# Patient Record
Sex: Male | Born: 1975 | Race: White | Marital: Married | Smoking: Never smoker
Health system: Southern US, Community
[De-identification: ages and names within clinical notes are randomized; demographics above are authoritative.]

---

## 2012-06-03 ENCOUNTER — Other Ambulatory Visit: Payer: Self-pay | Admitting: Family Medicine

## 2012-06-08 ENCOUNTER — Ambulatory Visit
Admission: RE | Admit: 2012-06-08 | Discharge: 2012-06-08 | Disposition: A | Discharge status: Home / Self Care | Payer: BC Managed Care – PPO | Source: Ambulatory Visit | Attending: Family Medicine | Admitting: Family Medicine

## 2012-06-08 DIAGNOSIS — N50811 Right testicular pain: Secondary | ICD-10-CM

## 2013-10-25 ENCOUNTER — Ambulatory Visit (INDEPENDENT_AMBULATORY_CARE_PROVIDER_SITE_OTHER): Payer: BC Managed Care – PPO | Admitting: General Surgery

## 2013-10-25 ENCOUNTER — Encounter (INDEPENDENT_AMBULATORY_CARE_PROVIDER_SITE_OTHER): Payer: Self-pay | Admitting: General Surgery

## 2013-10-25 VITALS — BP 142/86 | HR 71 | Temp 97.2°F | Resp 16 | Ht 70.0 in | Wt 273.2 lb

## 2013-10-25 DIAGNOSIS — K644 Residual hemorrhoidal skin tags: Secondary | ICD-10-CM

## 2013-10-25 NOTE — Progress Notes (Signed)
Patient ID: Chad Todd, male   DOB: [birth year], 38 y.o.   MRN: [id number]  No chief complaint on file.   HPI Chad Todd is a 38 y.o. male.  The patient is a [employer] [employer] who presents today secondary to refills including anal skin tag. He states this gets irritated and he notices blood when he wipes after a bowel movement. He also states he has some itching and discomfort in the area. The patient does state he takes some supplemental fiber, Metamucil. He also states well-hydrated and has a large water bottle in the office. The patient states he notices that the discomfort is associated with increased activity to include walking and running. Additional states he has not Chad Todd for very long while having a bowel movement and has no constipation issues.  HPI  No past medical history on file.  No past surgical history on file.  No family history on file.  Social History History  Substance Use Topics  . Smoking status: Never Smoker   . Smokeless tobacco: Not on file  . Alcohol Use: No    No Known Allergies  No current outpatient prescriptions on file.   No current facility-administered medications for this visit.    Review of Systems Review of Systems  Constitutional: Negative.   HENT: Negative.   Eyes: Negative.   Respiratory: Negative.   Cardiovascular: Negative.   Gastrointestinal: Negative.   Endocrine: Negative.   Neurological: Negative.     Blood pressure 142/86, pulse 71, temperature 97.2 F (36.2 C), temperature source Temporal, resp. rate 16, height 5\' 10"  (1.778 m), weight 273 lb 3.2 oz (123.923 kg).  Physical Exam Physical Exam  Constitutional: He is oriented to person, place, and time. He appears well-developed and well-nourished.  HENT:  Head: Normocephalic and atraumatic.  Eyes: Conjunctivae and EOM are normal. Pupils are equal, round, and reactive to light.  Neck: Normal range of motion. Neck supple.  Cardiovascular: Normal rate, regular rhythm and  normal heart sounds.   Pulmonary/Chest: Effort normal and breath sounds normal.  Genitourinary:     Musculoskeletal: Normal range of motion.  Neurological: He is alert and oriented to person, place, and time.  Skin: Skin is warm and dry.    Data Reviewed none  Assessment    38 year old male with external skin tag at 12:00 in the lithotomy position, there is a small skin tear near the anus. There is no external hemorrhoids or internal hemorrhoids.     Plan    1.  The patient's would like to do to proceed with exam under anesthesia and excision of the anal skin tag versus in clinic procedure of excision of anal skin tag. 2. Discussed with them the we can proceed in scheduling the procedure once he is about this decision.        Chad Ehlersamirez Jr., Chad Todd 10/25/2013, 2:46 PM

## 2013-11-14 IMAGING — US US ART/VEN ABD/PELV/SCROTUM DOPPLER LTD
1 series · 14 of 25 positions shown · non-contrast
Comparison: None.

CLINICAL DATA: Right testicular pain

SCROTAL ULTRASOUND
DOPPLER ULTRASOUND OF THE TESTICLES
TECHNIQUE: Complete ultrasound examination of the testicles,
epididymis, and other scrotal structures was performed.  Color and
spectral Doppler ultrasound were also utilized to evaluate blood
flow to the testicles.

[Series 1: us art/ven abd/pelv/scrotum doppler ltd · 0.07mm/px · 14 of 68 slices shown]
[im 1/68]
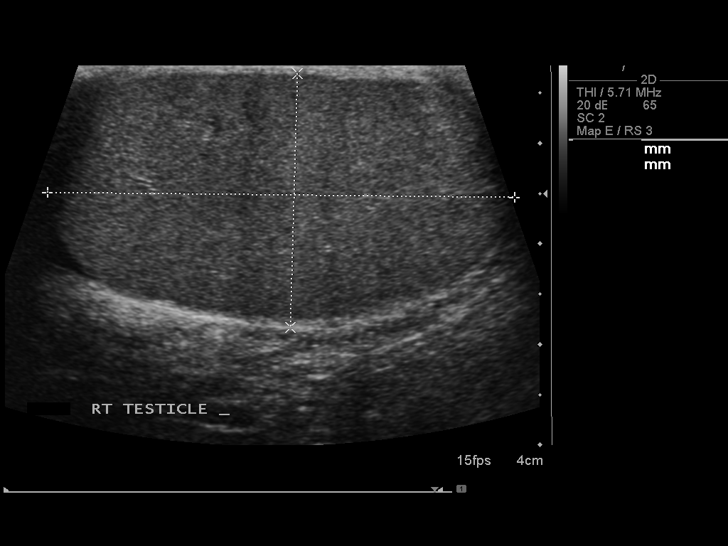
[im 6/68]
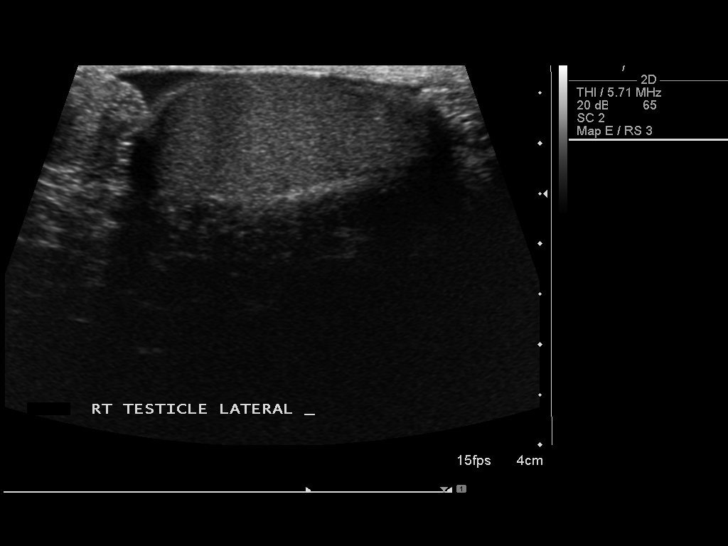
[im 12/68]
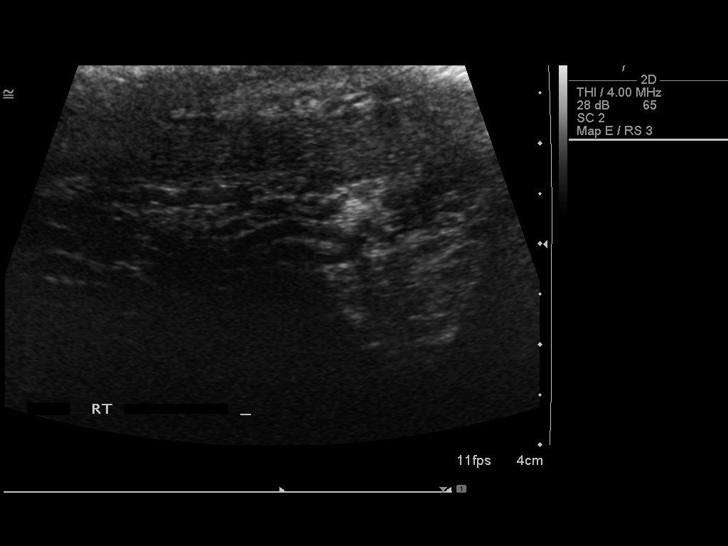
[im 17/68]
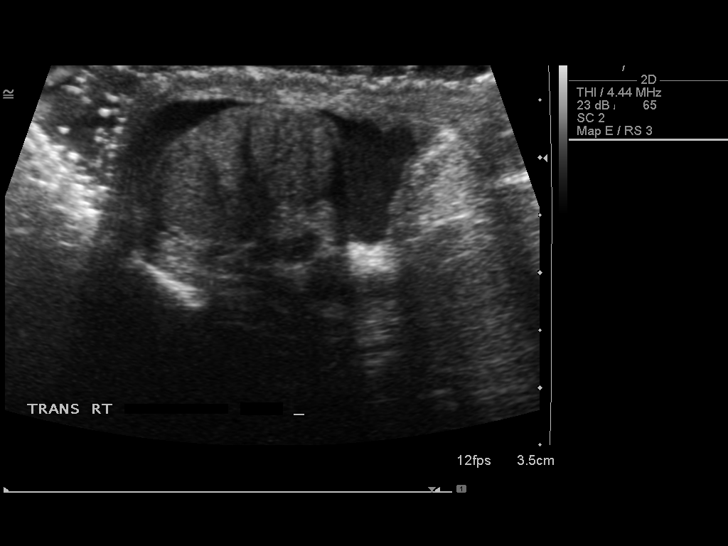
[im 23/68]
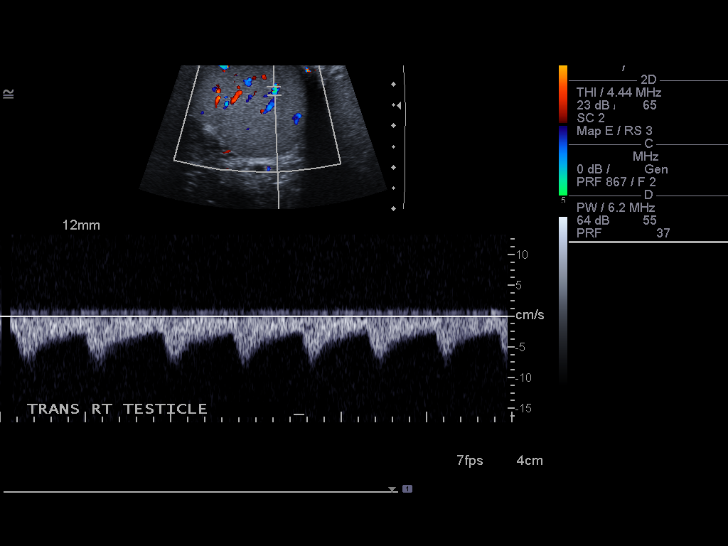
[im 26/68]
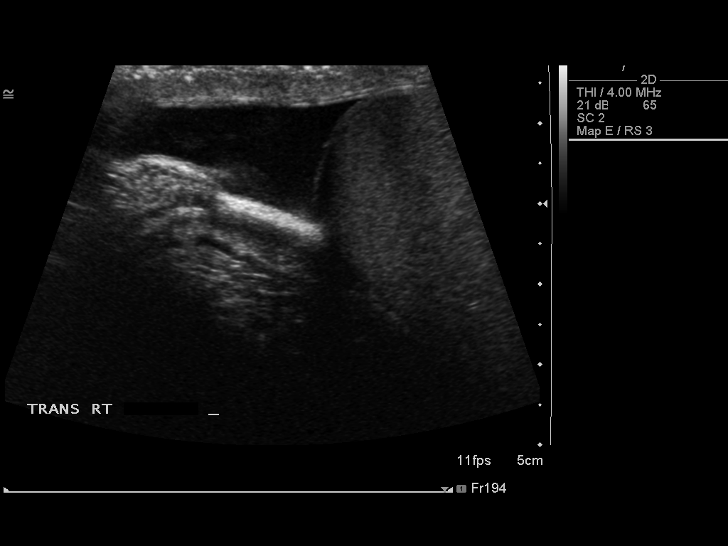
[im 31/68]
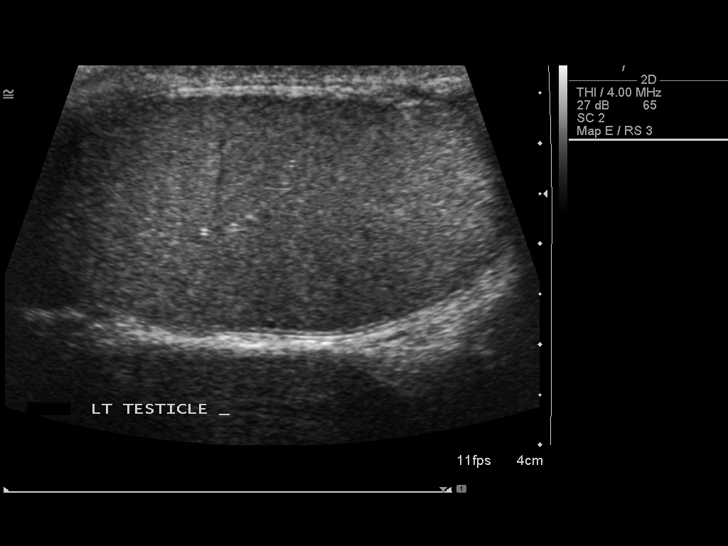
[im 37/68]
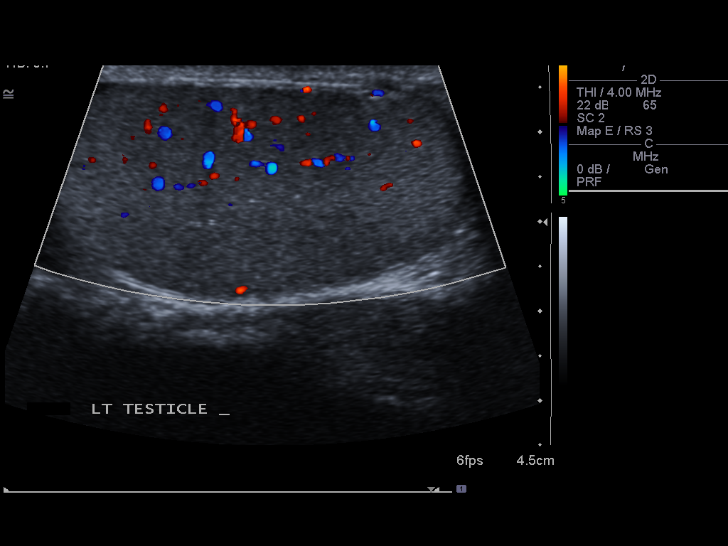
[im 42/68]
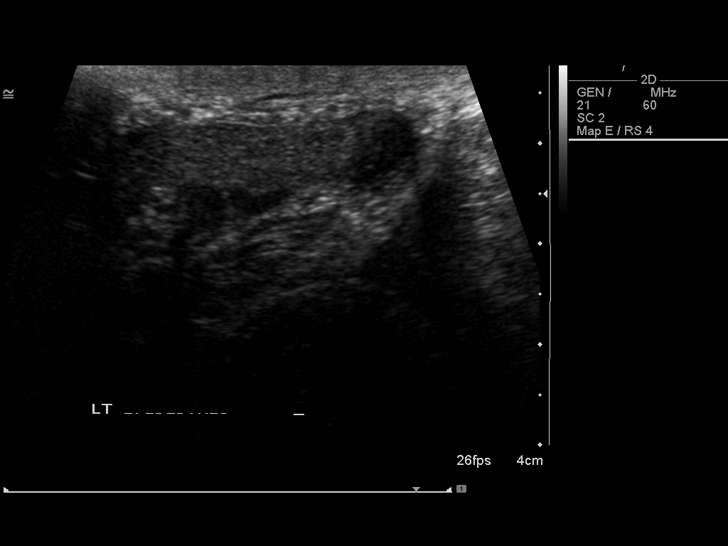
[im 45/68]
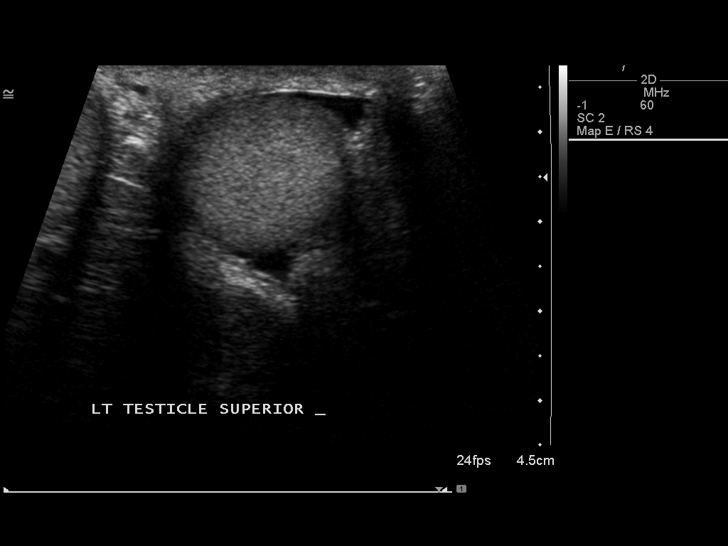
[im 51/68]
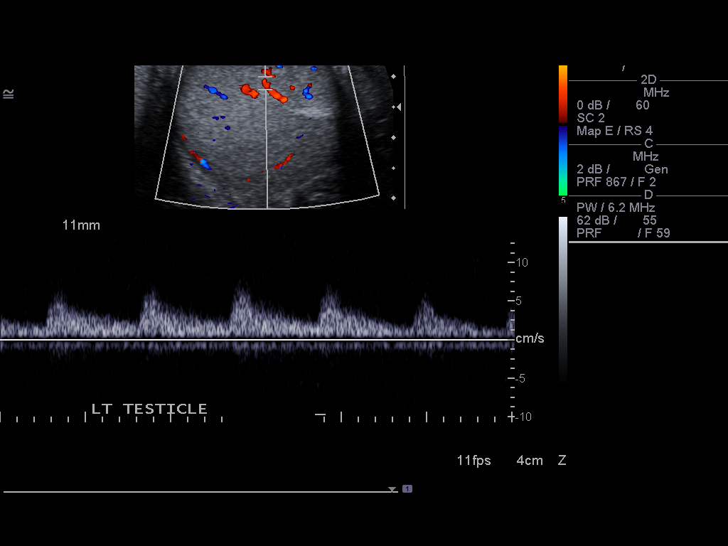
[im 56/68]
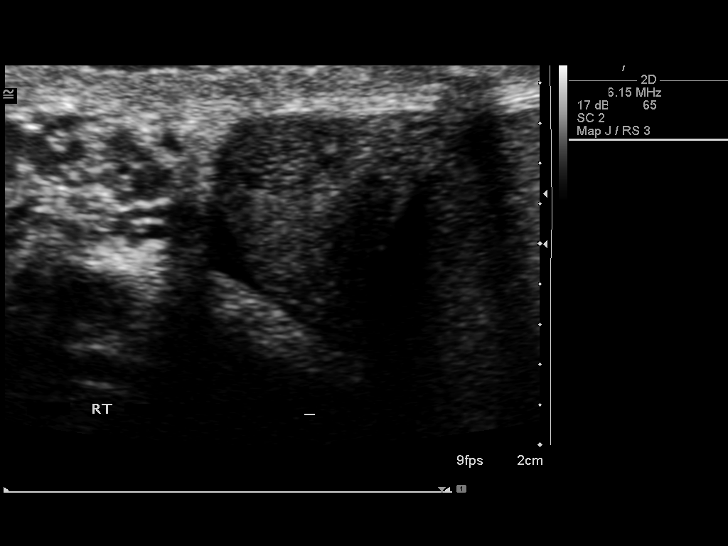
[im 62/68]
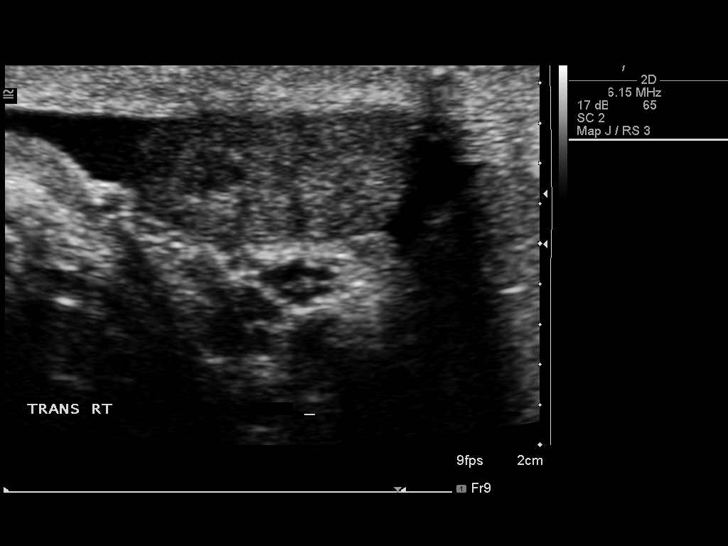
[im 68/68]
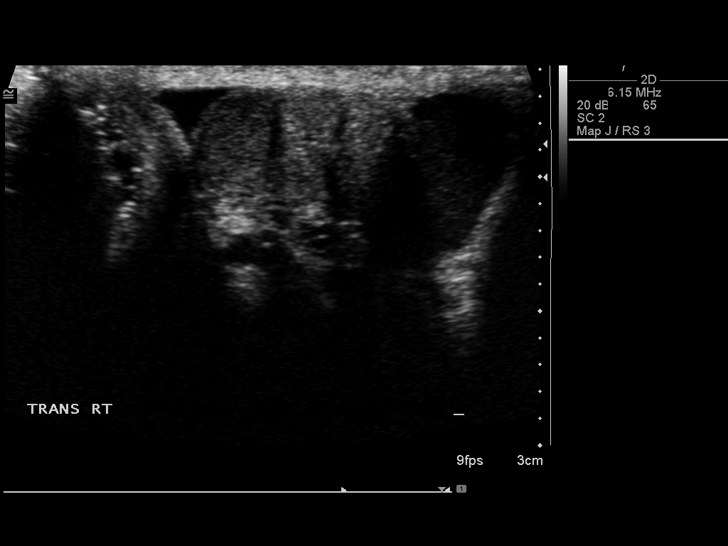

[14 of 25 positions shown; findings below may reference images not displayed]

FINDINGS: Right testis:  Normal in size appearance, measuring 4.6 x 2.5 x
cm.

Left testis:  Normal in size appearance, measuring 4.6 x 2.4 x
cm.

Right epididymis:  Normal in size and appearance.

Left epididymis:  Normal in size and appearance.

Hydrocele:  Absent.

Varicocele:  Absent.

Pulsed Doppler interrogation of both testes demonstrates low
resistance flow bilaterally.

The patient's palpable abnormality corresponds to the right
epididymal head, possibly mildly hypervascular although with a
normal gray scale appearance.
IMPRESSION: No evidence of testicular torsion.

Possible mild hypervascularity in the right epididymal head,
correlate for epididymitis.

## 2014-11-07 ENCOUNTER — Ambulatory Visit (INDEPENDENT_AMBULATORY_CARE_PROVIDER_SITE_OTHER): Payer: BLUE CROSS/BLUE SHIELD | Admitting: Family Medicine

## 2014-11-07 ENCOUNTER — Ambulatory Visit (INDEPENDENT_AMBULATORY_CARE_PROVIDER_SITE_OTHER): Payer: BLUE CROSS/BLUE SHIELD

## 2014-11-07 VITALS — BP 138/78 | HR 58 | Temp 97.8°F | Resp 16 | Ht 70.0 in | Wt 278.0 lb

## 2014-11-07 DIAGNOSIS — J189 Pneumonia, unspecified organism: Secondary | ICD-10-CM

## 2014-11-07 DIAGNOSIS — J181 Lobar pneumonia, unspecified organism: Secondary | ICD-10-CM

## 2014-11-07 DIAGNOSIS — R042 Hemoptysis: Secondary | ICD-10-CM

## 2014-11-07 DIAGNOSIS — R05 Cough: Secondary | ICD-10-CM

## 2014-11-07 DIAGNOSIS — R062 Wheezing: Secondary | ICD-10-CM | POA: Diagnosis not present

## 2014-11-07 DIAGNOSIS — R059 Cough, unspecified: Secondary | ICD-10-CM

## 2014-11-07 LAB — POCT CBC
GRANULOCYTE PERCENT: 59.9 % (ref 37–80)
HCT, POC: 48.4 % (ref 43.5–53.7)
Hemoglobin: 15.7 g/dL (ref 14.1–18.1)
Lymph, poc: 3 (ref 0.6–3.4)
MCH: 27.1 pg (ref 27–31.2)
MCHC: 32.4 g/dL (ref 31.8–35.4)
MCV: 83.6 fL (ref 80–97)
MID (CBC): 0.6 (ref 0–0.9)
MPV: 6.4 fL (ref 0–99.8)
PLATELET COUNT, POC: 452 10*3/uL — AB (ref 142–424)
POC Granulocyte: 5.3 (ref 2–6.9)
POC LYMPH %: 33.4 % (ref 10–50)
POC MID %: 6.7 %M (ref 0–12)
RBC: 5.79 M/uL (ref 4.69–6.13)
RDW, POC: 13.6 %
WBC: 8.9 10*3/uL (ref 4.6–10.2)

## 2014-11-07 MED ORDER — ALBUTEROL SULFATE HFA 108 (90 BASE) MCG/ACT IN AERS: 2.0000 | INHALATION_SPRAY | Freq: Four times a day (QID) | RESPIRATORY_TRACT | Status: AC | PRN

## 2014-11-07 MED ORDER — AZITHROMYCIN 250 MG PO TABS: ORAL_TABLET | ORAL | Status: AC

## 2014-11-07 MED ORDER — BENZONATATE 100 MG PO CAPS: 100.0000 mg | ORAL_CAPSULE | Freq: Three times a day (TID) | ORAL | Status: AC | PRN

## 2014-11-07 NOTE — Progress Notes (Signed)
  Subjective:  Patient ID: Chad Todd, male    DOB: March 11, 1976  Age: 39 y.o. MRN: [id number]  39 year old man who is here for cough. He's not been running a fever. He get a flulike illness about 10 days ago and was sick for several days with some fever and illness. When that finished got a cough and it is persisted all week. It is been a nagging daytime cough but not terrible. He has had some small amounts of hemoptysis through the week, but today he coughed up a large glob of blood. He is not febrile. He does not have headache or ear pain or fever or sore throat. He is generally healthy. 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   Objective:   No major distress. Slight cough that is wet sounding. TMs normal. Throat clear. Neck supple without nodes. Chest has rhonchi at both bases, more on the right than the left. Heart regular without murmurs. Soft expiratory wheeze. Peak flow was 540 with a predicted value of about 600  Assessment & Plan:   Assessment:  Cough Hemoptysis  Plan:  Chest x-ray and CBC  [facility] reading (PRIMARY) by  Dr. Alwyn Ren Left lower lobe infiltrate obscuring the diaphragm not seen on lateral  Results for orders placed or performed in visit on 11/07/14  POCT CBC  Result Value Ref Range   WBC 8.9 4.6 - 10.2 K/uL   Lymph, poc 3.0 0.6 - 3.4   POC LYMPH PERCENT 33.4 10 - 50 %L   MID (cbc) 0.6 0 - 0.9   POC MID % 6.7 0 - 12 %M   POC Granulocyte 5.3 2 - 6.9   Granulocyte percent 59.9 37 - 80 %G   RBC 5.79 4.69 - 6.13 M/uL   Hemoglobin 15.7 14.1 - 18.1 g/dL   HCT, POC 40.9 81.1 - 53.7 %   MCV 83.6 80 - 97 fL   MCH, POC 27.1 27 - 31.2 pg   MCHC 32.4 31.8 - 35.4 g/dL   RDW, POC 91.4 %   Platelet Count, POC 452 (A) 142 - 424 K/uL   MPV 6.4 0 - 99.8 fL   .    There are no Patient Instructions on file for this visit.   Tamu Golz, MD 11/07/2014

## 2014-11-07 NOTE — Patient Instructions (Signed)
Drink plenty of fluids and get enough rest  Take the benzonatate cough pills one or 2 pills 3 times daily as needed for cough. If you're coughing a lot at nighttime you can take some thing like Robitussin-DM in addition to this.  Take the azithromycin 200 mg 2 pills initially, then 1 daily for 4 days  Take the albuterol inhaler 2 inhalations every 4-6 hours as needed for wheezing and coughing  Return at anytime if worse  Plan to return in the next week or 2 for a follow-up chest x-ray and exam

## 2016-04-14 IMAGING — CR DG CHEST 2V
2 series · 2 of 2 positions shown · non-contrast
Comparison: None.

CLINICAL DATA: Left lower lobe infiltrate.

EXAM:
CHEST  2 VIEW

[PA]
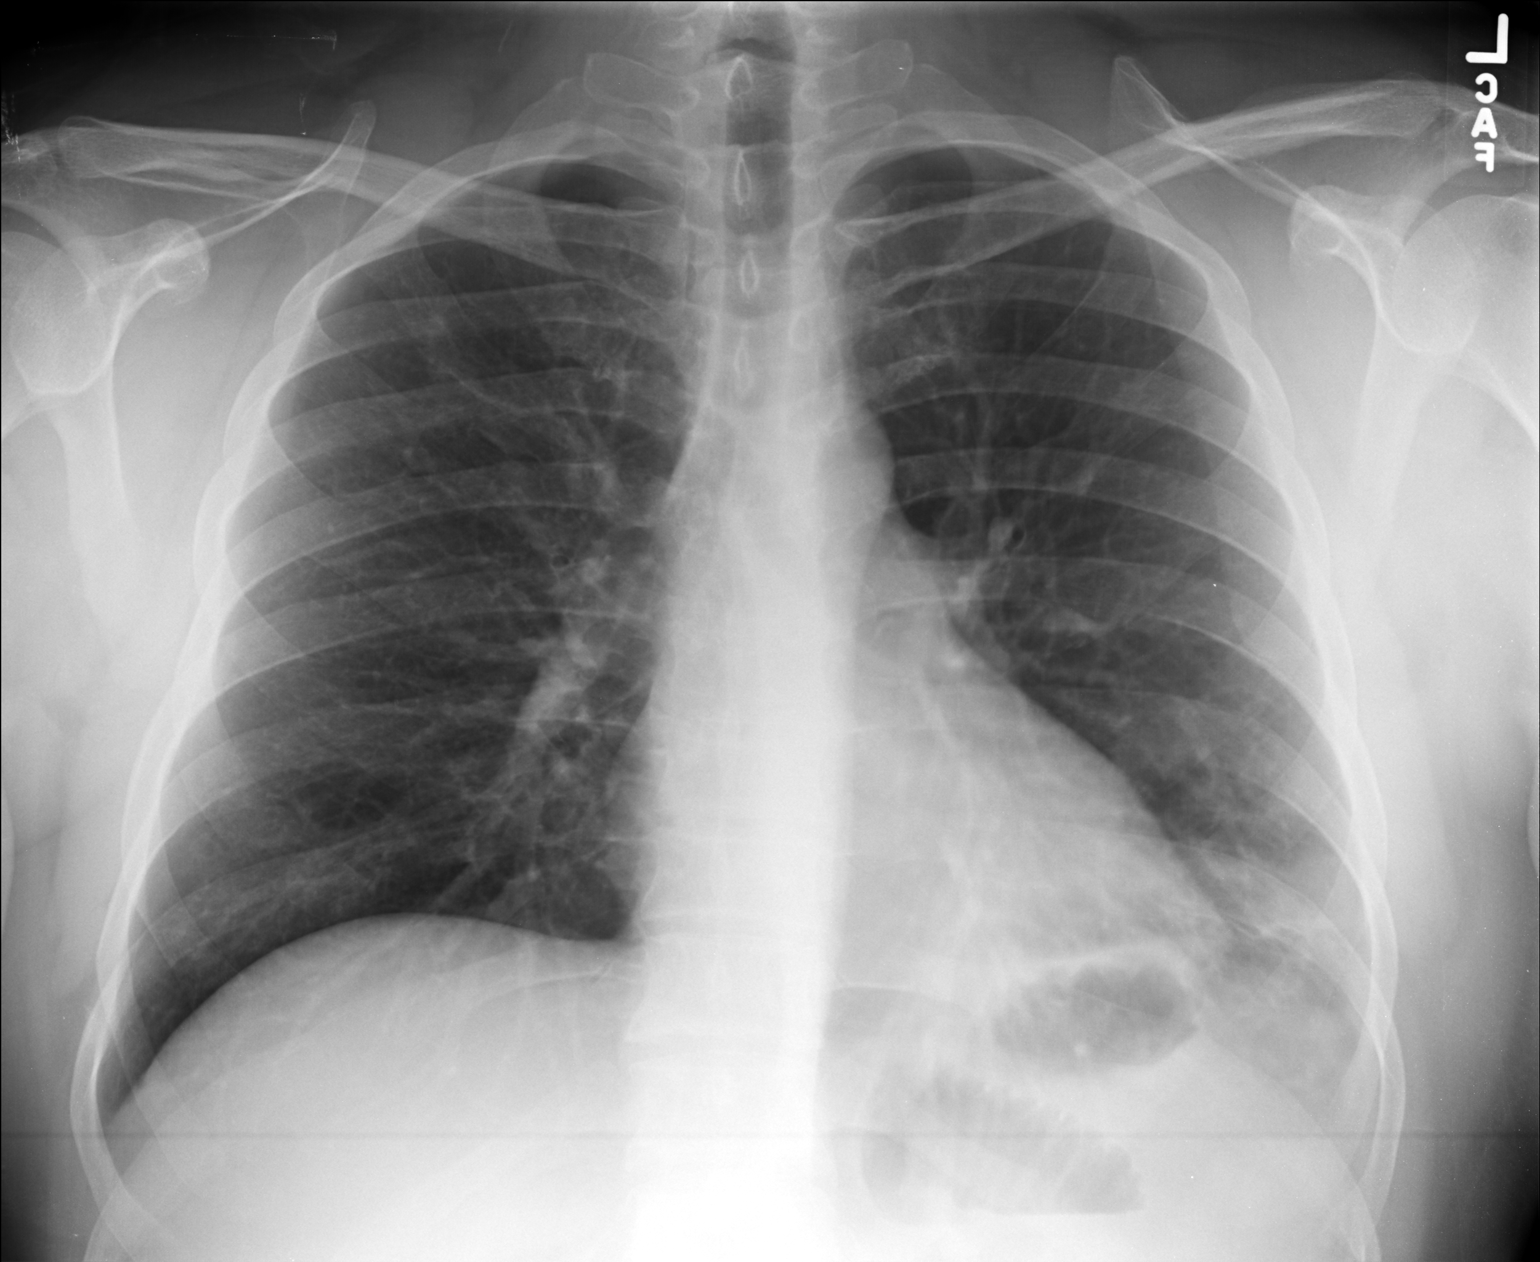

[lateral]
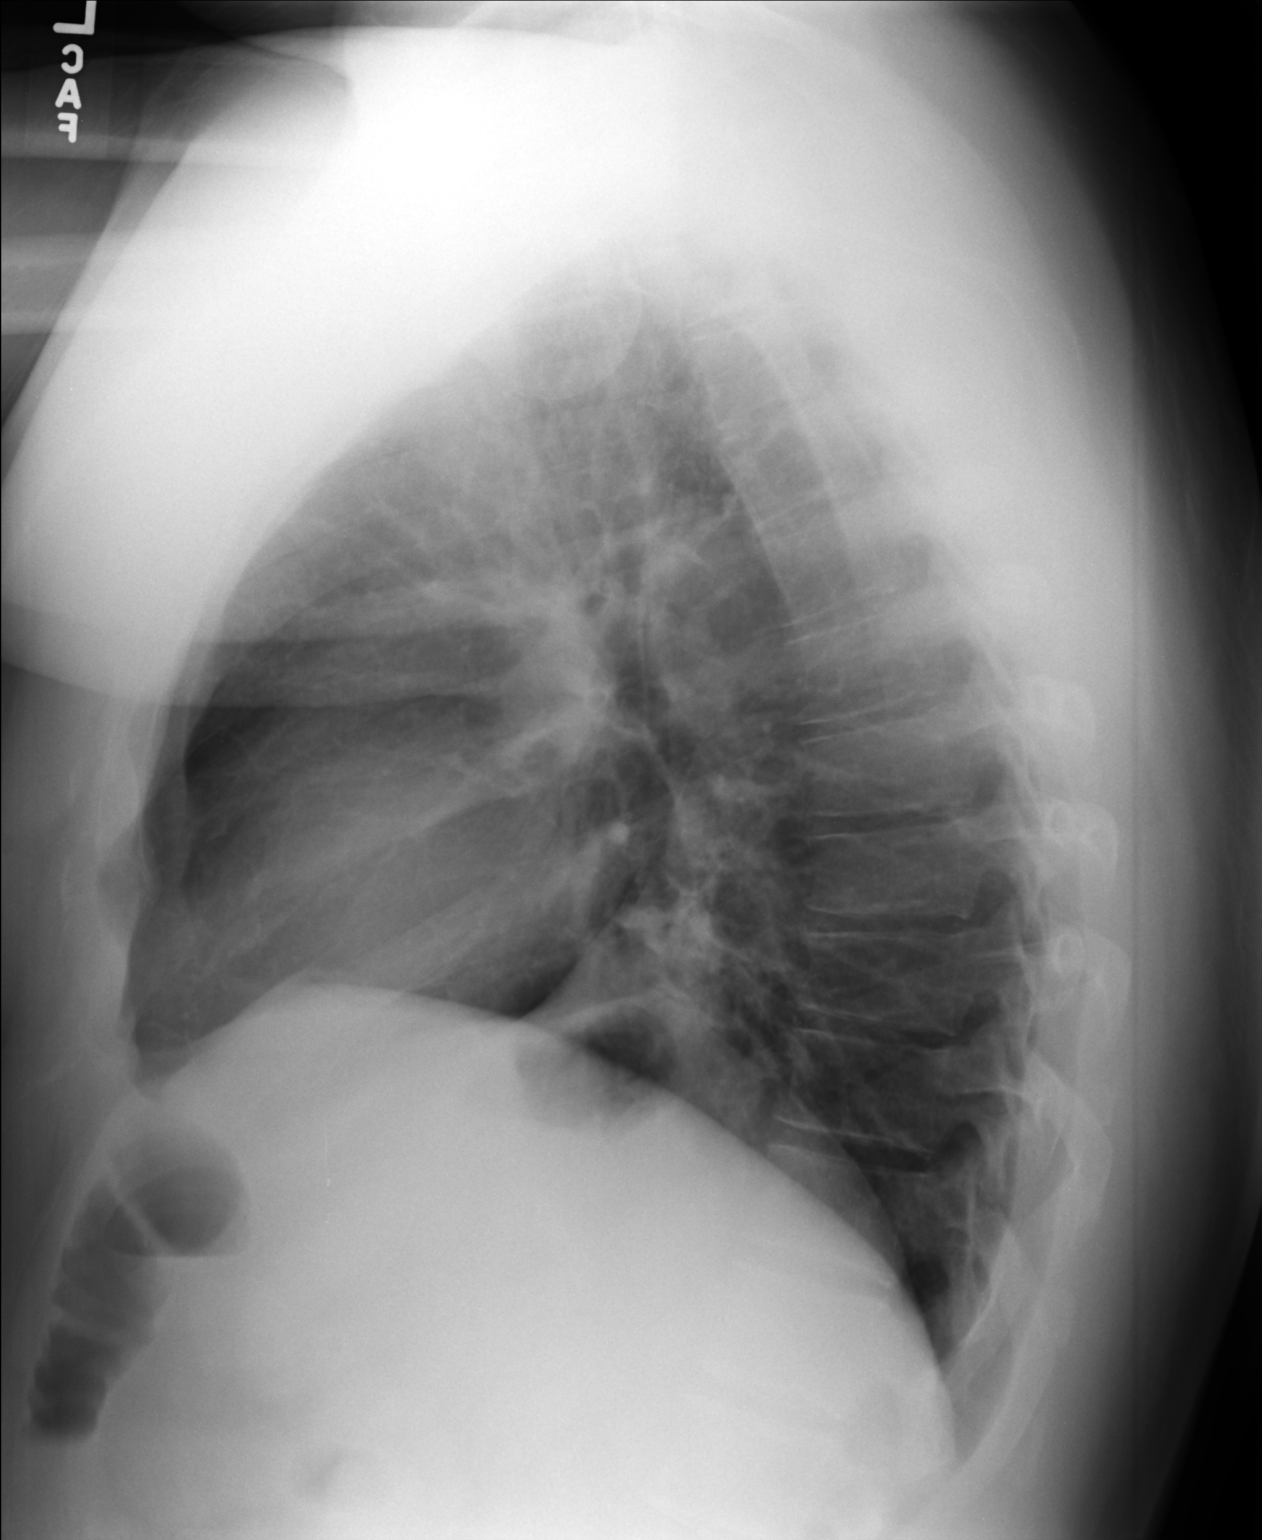

[2 of 2 positions shown; findings below may reference images not displayed]

FINDINGS: Mediastinum and hilar structures normal. Left lower lobe infiltrate
noted consistent pneumonia. Small left pleural effusion. No
pneumothorax. Heart size normal. No acute bony abnormality
IMPRESSION: Left lower lobe infiltrate consistent with pneumonia.
